# Patient Record
Sex: Female | Born: 2000 | Race: Black or African American | Hispanic: No | Marital: Single | Smoking: Never smoker
Health system: Southern US, Community
[De-identification: ages and names within clinical notes are randomized; demographics above are authoritative.]

---
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
## 2019-07-21 IMAGING — MR MR KNEE*R* W/O CM
7 of 16 series · 19 of 40 positions shown · non-contrast
Comparison: None.

CLINICAL DATA: Six years of constant worsening right knee pain. No
prior surgery or known injury.

EXAM:
MRI OF THE RIGHT KNEE WITHOUT CONTRAST
TECHNIQUE: Multiplanar, multisequence MR imaging of the knee was performed. No
intravenous contrast was administered.

[Series 3: PD fat-sat · axial · 4.0mm · 0.31mm/px · z∈[-44,+62]mm · 4 of 25 slices shown (1 of 6)]
[im 1/25]
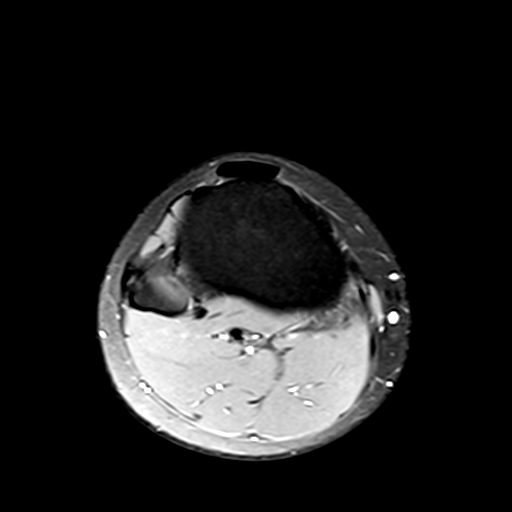
[im 9/25]
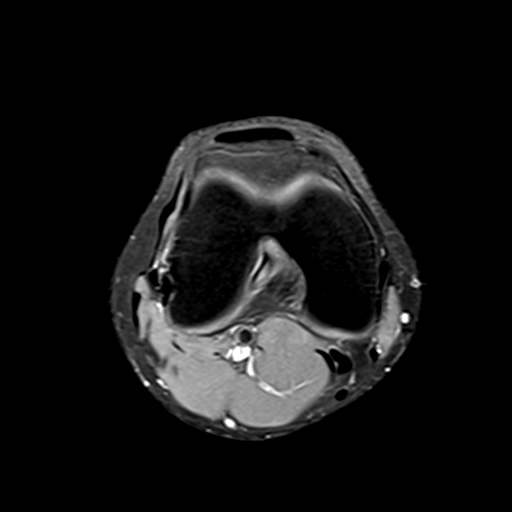
[im 17/25]
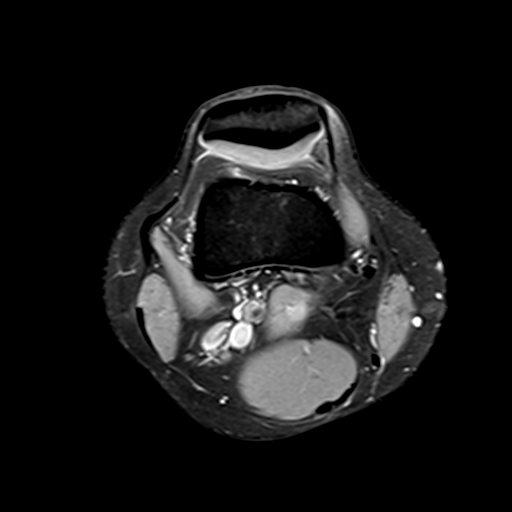
[im 25/25]
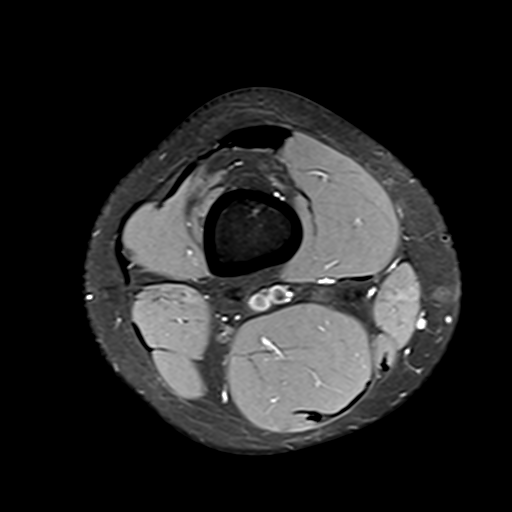

[Series 5: T2 fat-sat · coronal · 4.0mm · 0.31mm/px · 3 of 21 slices shown]
[im 1/21]
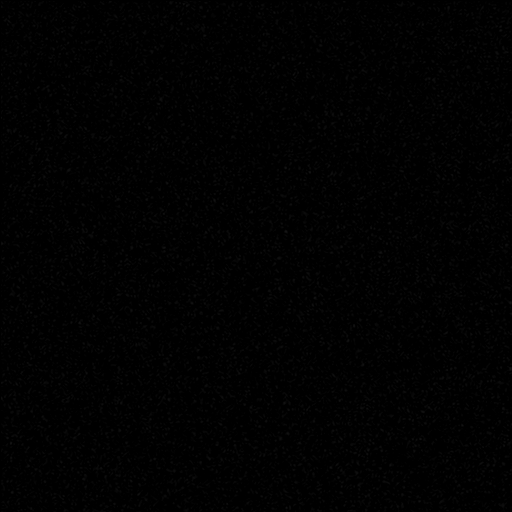
[im 11/21]
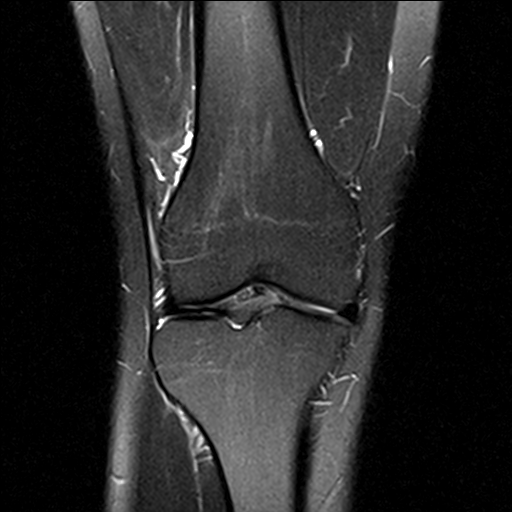
[im 21/21]
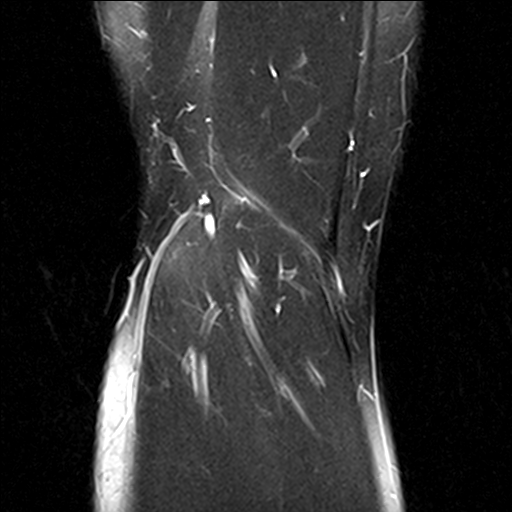

[Series 6: PD fat-sat · coronal · 4.0mm · 0.31mm/px · 4 of 21 slices shown (2 of 6)]
[im 1/21]
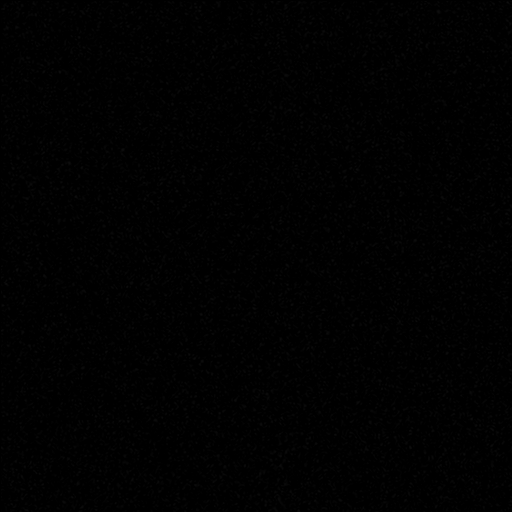
[im 7/21]
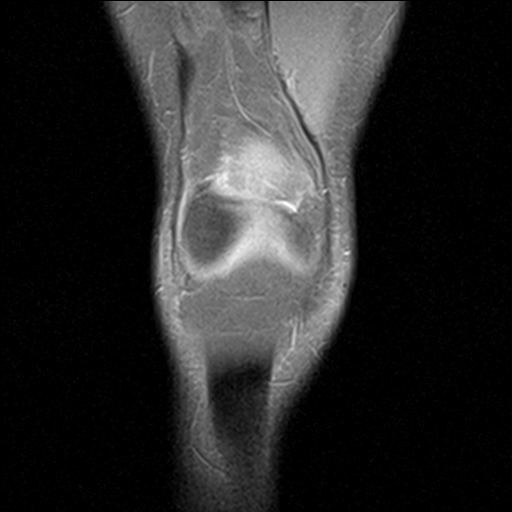
[im 14/21]
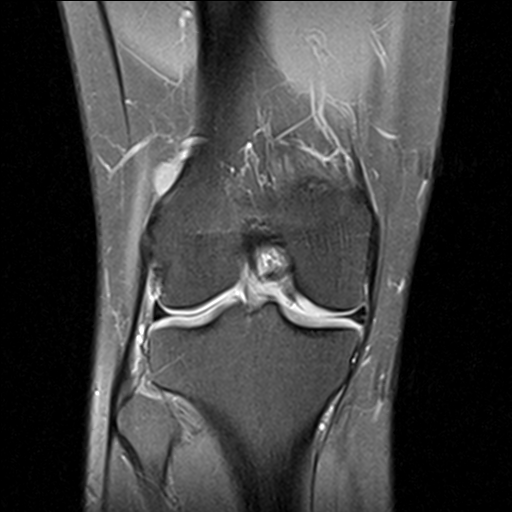
[im 21/21]
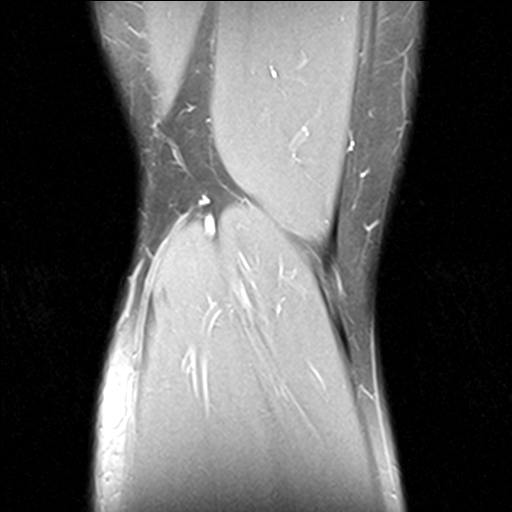

[Series 7: PD fat-sat · sagittal · 4.0mm · 0.31mm/px · 4 of 22 slices shown (3 of 6)]
[im 1/22]
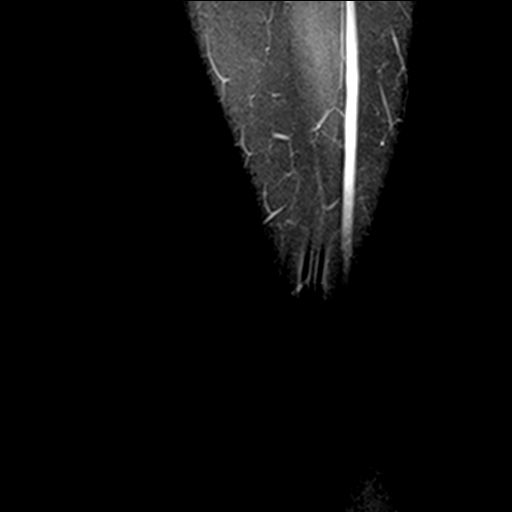
[im 8/22]
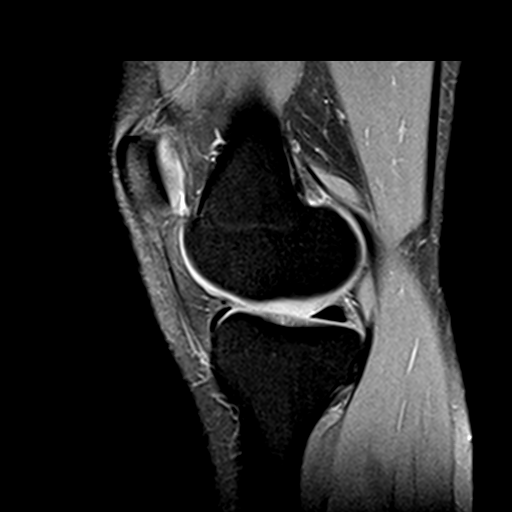
[im 15/22]
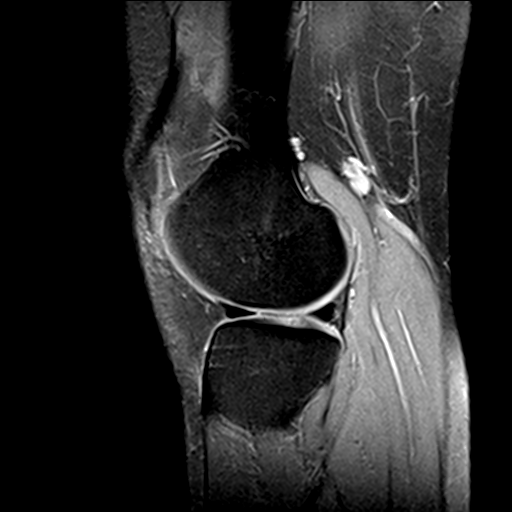
[im 22/22]
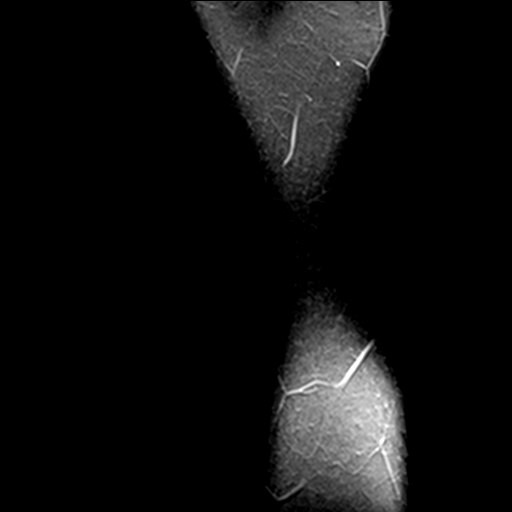

[Series 8: PD fat-sat · oblique · 2.0mm · 0.29mm/px · 2 of 11 slices shown (4 of 6)]
[im 1/11]
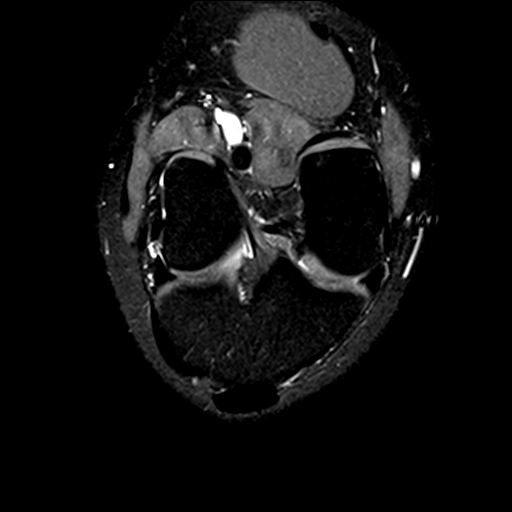
[im 11/11]
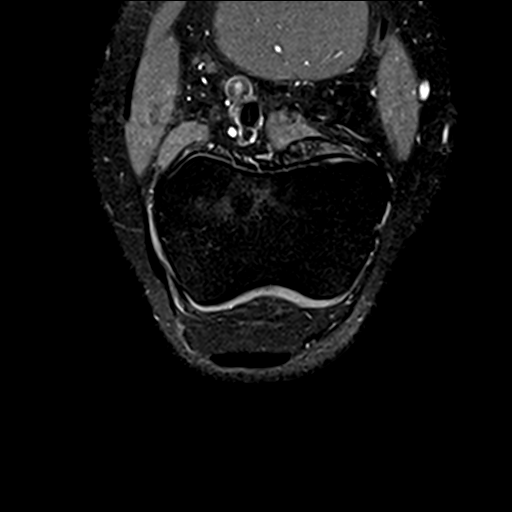

[Series 5006: PD fat-sat · axial · 4.0mm · 0.38mm/px · 1 of 1 slices shown (5 of 6)]
[im 1/1]
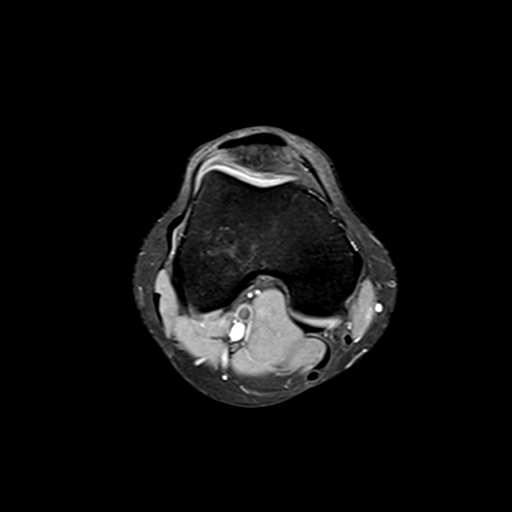

[Series 5007: PD fat-sat · axial · 4.0mm · 0.40mm/px · 1 of 1 slices shown (6 of 6)]
[im 1/1]
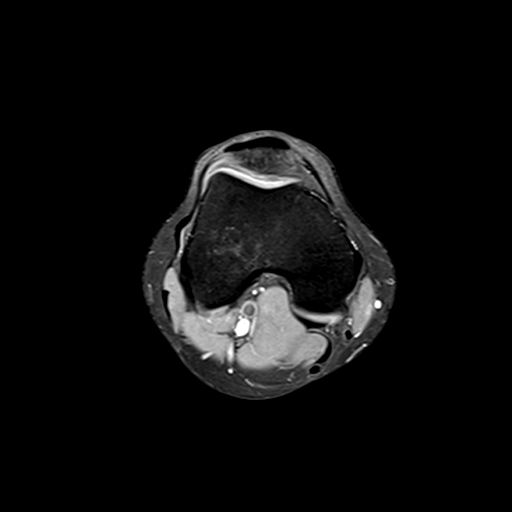

[19 of 40 positions shown; findings below may reference images not displayed]

FINDINGS: MENISCI

Medial meniscus:  Intact.

Lateral meniscus:  Intact.

LIGAMENTS

Cruciates:  Intact ACL and PCL.

Collaterals: Medial collateral ligament is intact. Lateral
collateral ligament complex is intact.

CARTILAGE

Patellofemoral: Tiny cartilaginous fissure overlying the median
ridge. No significant chondromalacia.

Medial:  Intact

Lateral:  Intact

Joint:  No joint effusion.  Normal Hoffa's fat pad.

Popliteal Fossa:  No popliteal cyst.  Intact popliteus.

Extensor Mechanism: Borderline patella alta with Insall-Salvati
ratio of patellar tendon length to patellar length at 1.2, upper
limits normal. MPFL is normal. Intact extensor mechanism tendons.

Bones:  No marrow signal abnormality, fracture or joint dislocation.

Other: None
IMPRESSION: 1. Tiny cartilaginous fissure overlying the median ridge of the
patella.
2. Borderline patella alta.
3. Intact cruciate and collateral ligaments. No significant chondral
thinning.
4. No marrow signal abnormalities.
